# Patient Record
Sex: Female | Born: 1955 | Race: White | Hispanic: No | Marital: Single
Health system: Southern US, Community
[De-identification: ages and names within clinical notes are randomized; demographics above are authoritative.]

---

## 2020-08-19 ENCOUNTER — Other Ambulatory Visit: Payer: Self-pay

## 2020-08-19 ENCOUNTER — Emergency Department (HOSPITAL_COMMUNITY)
Admission: EM | Admit: 2020-08-19 | Discharge: 2020-08-20 | Disposition: A | Discharge status: Home / Self Care | Payer: Medicare HMO | Attending: Emergency Medicine | Admitting: Emergency Medicine

## 2020-08-19 ENCOUNTER — Emergency Department (HOSPITAL_COMMUNITY): Payer: Medicare HMO

## 2020-08-19 DIAGNOSIS — M25462 Effusion, left knee: Secondary | ICD-10-CM | POA: Insufficient documentation

## 2020-08-19 DIAGNOSIS — M25562 Pain in left knee: Secondary | ICD-10-CM

## 2020-08-19 NOTE — ED Triage Notes (Signed)
Pt came in with c/o L knee pain that started yesterday. Pt states she has hx of arthritis in both knees. Swelling noted. Pt states it radiates to her buttocks and foot

## 2020-08-19 NOTE — ED Provider Notes (Signed)
[facility]-EMERGENCY DEPT Provider Note   CSN: [id number] Arrival date & time: 08/19/20  2224     History Chief Complaint  Patient presents with   Knee Pain    Amy Daniels is a 65 y.o. female.  66 year old female presents with complaint of medial left knee pain.  Reports history of osteoporosis, states that she was walking yesterday when she felt a snap in her knee.  Patient continues to be ambulatory with her cane.      No past medical history on file.  There are no problems to display for this patient.   OB History   No obstetric history on file.     No family history on file.     Home Medications Prior to Admission medications   Not on File    Allergies    Shellfish allergy and Penicillins  Review of Systems   Review of Systems  Constitutional:  Negative for fever.  Musculoskeletal:  Positive for arthralgias and gait problem. Negative for myalgias.  Skin:  Negative for color change, rash and wound.  Neurological:  Negative for weakness and numbness.   Physical Exam Updated Vital Signs BP 126/67 (BP Location: Right Arm)   Pulse 71   Temp 98.4 F (36.9 C) (Oral)   Resp 18   SpO2 95%   Physical Exam Vitals and nursing note reviewed.  Constitutional:      General: She is not in acute distress.    Appearance: She is well-developed. She is not diaphoretic.  HENT:     Head: Normocephalic and atraumatic.  Cardiovascular:     Pulses: Normal pulses.  Pulmonary:     Effort: Pulmonary effort is normal.  Musculoskeletal:        General: Tenderness present. No swelling or deformity.     Right lower leg: No edema.     Left lower leg: No edema.     Comments: Tenderness with palpation of proximal left medial knee with small effusion, no crepitus, ecchymosis.  No pain to remaining lower leg or left hip.  Skin:    General: Skin is warm and dry.     Findings: No erythema.  Neurological:     Mental Status: She is alert and oriented  to person, place, and time.     Sensory: No sensory deficit.     Motor: No weakness.  Psychiatric:        Behavior: Behavior normal.    ED Results / Procedures / Treatments   Labs (all labs ordered are listed, but only abnormal results are displayed) Labs Reviewed - No data to display  EKG None  Radiology DG Knee Complete 4 Views Left  Result Date: 08/19/2020 CLINICAL DATA:  Felt pop while walking yesterday, pain to the medial and posterior knee EXAM: LEFT KNEE - COMPLETE 4+ VIEW COMPARISON:  None. FINDINGS: The osseous structures appear diffusely demineralized which may limit detection of small or nondisplaced fractures. No acute bony abnormality. Specifically, no fracture, subluxation, or dislocation. Small suprapatellar effusion. Mild soft tissue swelling.Mild tricompartmental degenerative changes of the knee with periarticular spurring. IMPRESSION: Small suprapatellar effusion.  No acute osseous abnormality. Mild tricompartmental degenerative changes. Electronically Signed   By: Kreg Shropshire M.D.   On: 08/19/2020 23:16    Procedures Procedures   Medications Ordered in ED Medications - No data to display  ED Course  I have reviewed the triage vital signs and the nursing notes.  Pertinent labs & imaging results that were available during my  care of the patient were reviewed by me and considered in my medical decision making (see chart for details).  Clinical Course as of 08/19/20 2342  Sat Aug 19, 2020  1663 65 year old female with complaint of left knee pain found to have small left knee effusion with tenderness to proximal medial tibia.  X-ray shows effusion, difficult to rule out fracture due to her osteoporosis, a CT has been ordered.  Care is signed out pending CT of her knee. [LM]    Clinical Course User Index [LM] Alden Hipp   MDM Rules/Calculators/A&P                           Final Clinical Impression(s) / ED Diagnoses Final diagnoses:  Acute pain of  left knee    Rx / DC Orders ED Discharge Orders     None        Alden Hipp 08/19/20 2342    Linwood Dibbles, MD 08/21/20 [phone]

## 2020-08-20 ENCOUNTER — Emergency Department (HOSPITAL_COMMUNITY): Payer: Medicare HMO

## 2020-08-20 MED ORDER — OXYCODONE-ACETAMINOPHEN 5-325 MG PO TABS
2.0000 | ORAL_TABLET | Freq: Once | ORAL | Status: AC
  Administered 2020-08-20: 2 via ORAL
  Filled 2020-08-20: qty 2

## 2020-08-20 MED ORDER — OXYCODONE-ACETAMINOPHEN 5-325 MG PO TABS: 1.0000 | ORAL_TABLET | Freq: Four times a day (QID) | ORAL | 0 refills | Status: AC | PRN

## 2020-08-20 MED ORDER — OXYCODONE-ACETAMINOPHEN 5-325 MG PO TABS
1.0000 | ORAL_TABLET | Freq: Once | ORAL | Status: AC
  Administered 2020-08-20: 1 via ORAL
  Filled 2020-08-20: qty 1

## 2020-08-20 NOTE — Discharge Instructions (Signed)
This CT scan showed a possible small fracture in your left knee.  Please wear the knee immobilizer/brace.  You need to not put any pressure on the left leg.  I have contacted our social worker to see if they can get you a wheelchair tomorrow.  They should be in touch.  Please call the orthopedic doctor.  Please take pain medication as prescribed.

## 2020-08-20 NOTE — ED Provider Notes (Signed)
Patient signed out to me at shift change.  Patient felt a pop in her left knee yesterday.  CT imaging shows small radiopaque density that could be a small displaced fracture.  She has tricompartment degenerative changes.  Patient placed in a knee immobilizer.  Given pain medication and instructions for orthopedic follow-up.  Patient discussed with Dr. Pilar Plate, who agrees with plan.   Roxy Horseman, PA-C 08/20/20 0235    Sabas Sous, MD 08/20/20 [phone]

## 2020-11-28 ENCOUNTER — Emergency Department (HOSPITAL_COMMUNITY)
Admission: EM | Admit: 2020-11-28 | Discharge: 2020-11-28 | Disposition: A | Discharge status: Home / Self Care | Payer: Medicare HMO | Attending: Emergency Medicine | Admitting: Emergency Medicine

## 2020-11-28 ENCOUNTER — Other Ambulatory Visit: Payer: Self-pay

## 2020-11-28 DIAGNOSIS — U071 COVID-19: Secondary | ICD-10-CM | POA: Insufficient documentation

## 2020-11-28 DIAGNOSIS — M545 Low back pain, unspecified: Secondary | ICD-10-CM | POA: Diagnosis not present

## 2020-11-28 DIAGNOSIS — R112 Nausea with vomiting, unspecified: Secondary | ICD-10-CM | POA: Insufficient documentation

## 2020-11-28 DIAGNOSIS — Z5321 Procedure and treatment not carried out due to patient leaving prior to being seen by health care provider: Secondary | ICD-10-CM | POA: Diagnosis not present

## 2020-11-28 DIAGNOSIS — R0602 Shortness of breath: Secondary | ICD-10-CM | POA: Insufficient documentation

## 2020-11-28 NOTE — ED Notes (Signed)
Pt called again x3 for vitals recheck, still no response. Moving pt OTF. 

## 2020-11-28 NOTE — ED Notes (Signed)
Pt stickers was left by my computer im not sure if she left I will call her name again to make sure.

## 2020-11-28 NOTE — ED Triage Notes (Signed)
Pt here for eval of shob, n/v, and back pain after being dx with covid here on Friday. Wears 2L O2 at home-97% SpO2 on same.

## 2022-05-05 IMAGING — CR DG KNEE COMPLETE 4+V*L*
4 series · 4 of 4 positions shown · non-contrast
Comparison: None.

CLINICAL DATA: Felt pop while walking yesterday, pain to the medial
and posterior knee

EXAM:
LEFT KNEE - COMPLETE 4+ VIEW

[t knee ap left]
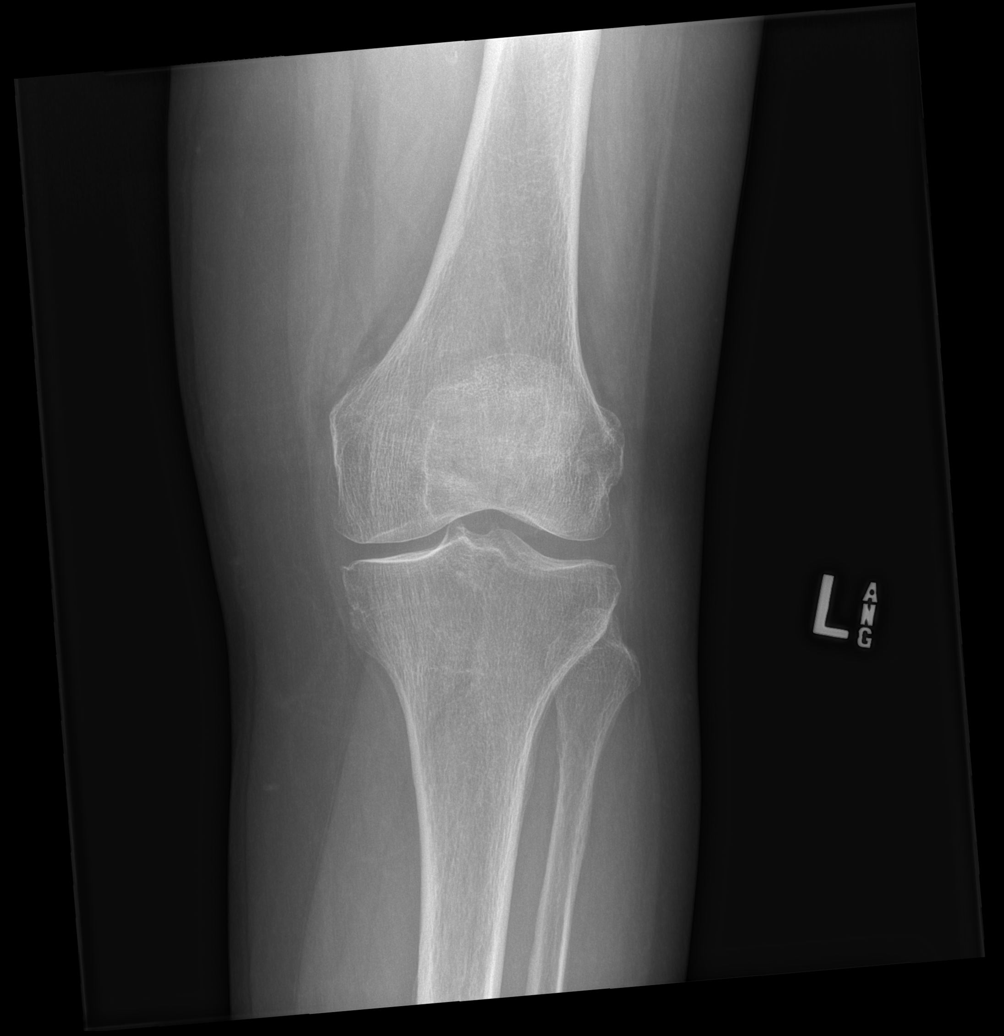

[t knee obl left (1 of 2)]
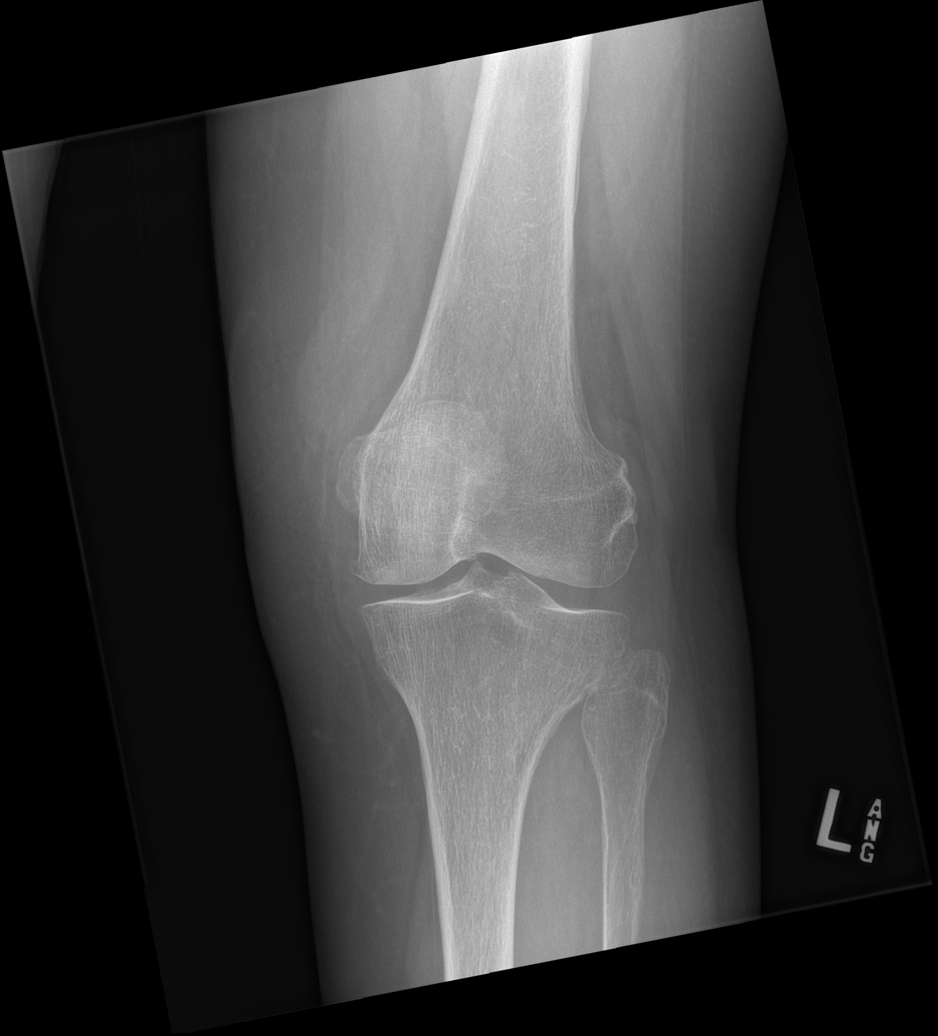

[t knee obl left (2 of 2)]
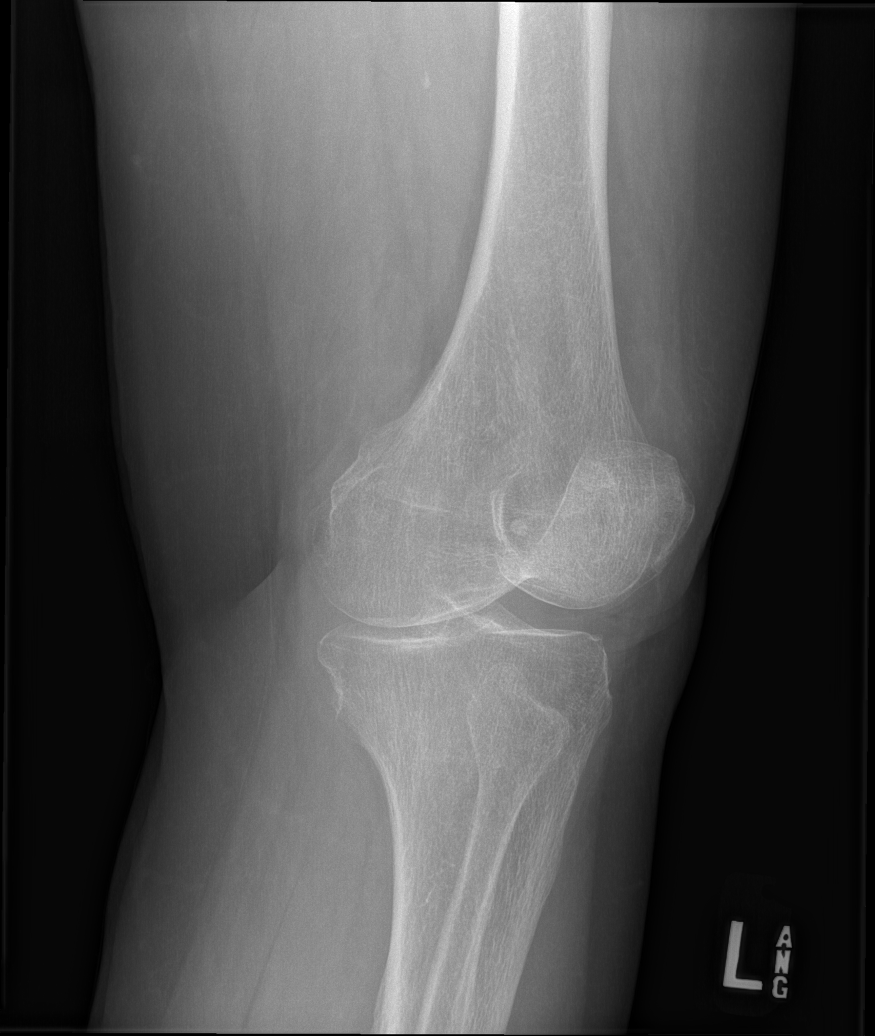

[t knee lat left]
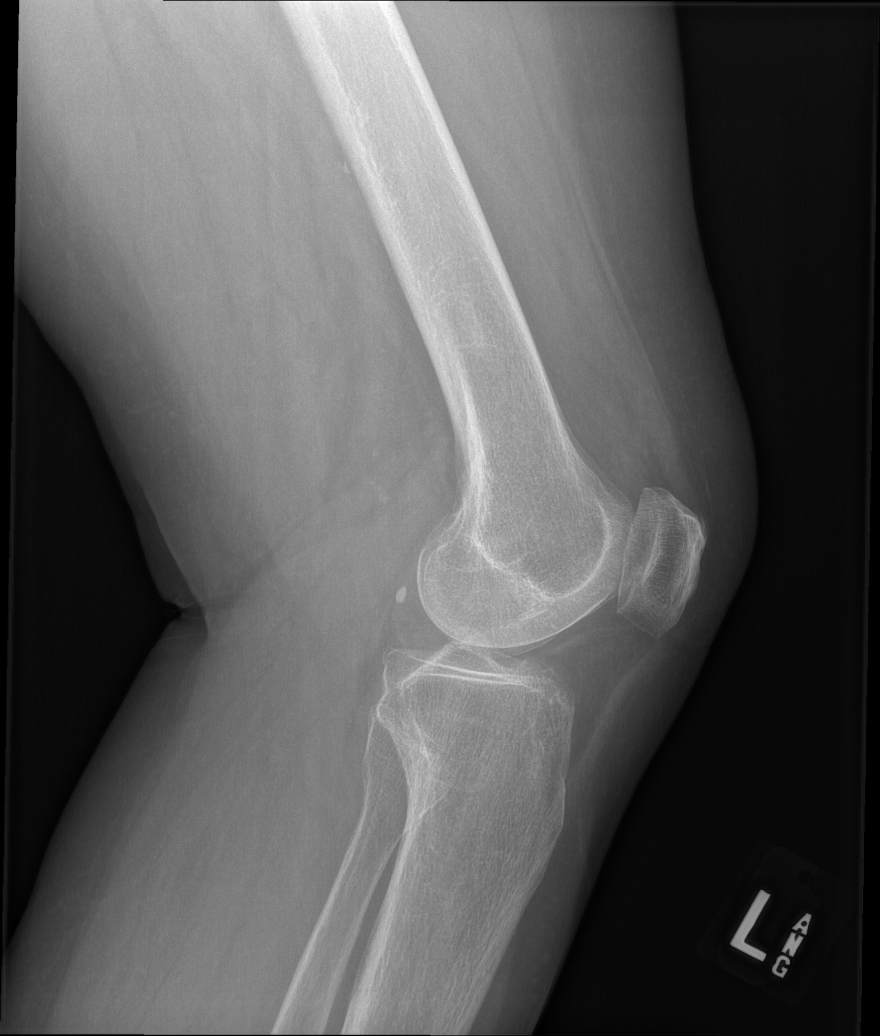

[4 of 4 positions shown; findings below may reference images not displayed]

FINDINGS: The osseous structures appear diffusely demineralized which may
limit detection of small or nondisplaced fractures. No acute bony
abnormality. Specifically, no fracture, subluxation, or dislocation.
Small suprapatellar effusion. Mild soft tissue swelling.Mild
tricompartmental degenerative changes of the knee with periarticular
spurring.
IMPRESSION: Small suprapatellar effusion.  No acute osseous abnormality.

Mild tricompartmental degenerative changes.
